# Patient Record
Sex: Male | Born: 1960 | Race: White | Hispanic: Yes | Marital: Single | Smoking: Current every day smoker
Health system: Southern US, Community
[De-identification: ages and names within clinical notes are randomized; demographics above are authoritative.]

---

## 2007-02-22 ENCOUNTER — Emergency Department (HOSPITAL_COMMUNITY): Admission: EM | Admit: 2007-02-22 | Discharge: 2007-02-22 | Payer: Self-pay | Admitting: Emergency Medicine

## 2011-01-12 ENCOUNTER — Emergency Department (HOSPITAL_COMMUNITY)
Admission: EM | Admit: 2011-01-12 | Discharge: 2011-01-12 | Disposition: A | Discharge status: Home / Self Care | Payer: Managed Care, Other (non HMO) | Attending: Emergency Medicine | Admitting: Emergency Medicine

## 2011-01-12 ENCOUNTER — Emergency Department (HOSPITAL_COMMUNITY): Payer: Managed Care, Other (non HMO)

## 2011-01-12 DIAGNOSIS — R071 Chest pain on breathing: Secondary | ICD-10-CM | POA: Insufficient documentation

## 2011-01-12 LAB — CBC
HCT: 40.5 % (ref 39.0–52.0)
Hemoglobin: 14 g/dL (ref 13.0–17.0)
MCH: 27.3 pg (ref 26.0–34.0)
MCHC: 34.6 g/dL (ref 30.0–36.0)
MCV: 79.1 fL (ref 78.0–100.0)
Platelets: 216 K/uL (ref 150–400)
RBC: 5.12 MIL/uL (ref 4.22–5.81)
RDW: 13.3 % (ref 11.5–15.5)
WBC: 9.5 K/uL (ref 4.0–10.5)

## 2011-01-12 LAB — DIFFERENTIAL
Basophils Absolute: 0 10*3/uL (ref 0.0–0.1)
Basophils Relative: 0 % (ref 0–1)
Eosinophils Absolute: 0.1 10*3/uL (ref 0.0–0.7)
Eosinophils Relative: 1 % (ref 0–5)
Lymphocytes Relative: 35 % (ref 12–46)
Lymphs Abs: 3.4 K/uL (ref 0.7–4.0)
Monocytes Absolute: 0.5 10*3/uL (ref 0.1–1.0)
Monocytes Relative: 5 % (ref 3–12)
Neutro Abs: 5.6 K/uL (ref 1.7–7.7)
Neutrophils Relative %: 59 % (ref 43–77)

## 2011-01-12 LAB — BASIC METABOLIC PANEL WITH GFR
CO2: 28 meq/L (ref 19–32)
Calcium: 9.1 mg/dL (ref 8.4–10.5)
Chloride: 106 meq/L (ref 96–112)
Creatinine, Ser: 0.79 mg/dL (ref 0.4–1.5)
Glucose, Bld: 139 mg/dL — ABNORMAL HIGH (ref 70–99)

## 2011-01-12 LAB — POCT CARDIAC MARKERS
CKMB, poc: <1 ng/mL — ABNORMAL LOW (ref 1.0–8.0)
Myoglobin, poc: 83.5 ng/mL (ref 12–200)
Troponin i, poc: <0.05 ng/mL (ref 0.00–0.09)

## 2011-01-12 LAB — BASIC METABOLIC PANEL
BUN: 9 mg/dL (ref 6–23)
GFR calc non Af Amer: >60 mL/min (ref >60)
Potassium: 3.8 mEq/L (ref 3.5–5.1)
Sodium: 139 mEq/L (ref 135–145)

## 2011-05-20 ENCOUNTER — Emergency Department (HOSPITAL_COMMUNITY)
Admission: EM | Admit: 2011-05-20 | Discharge: 2011-05-20 | Disposition: A | Discharge status: Home / Self Care | Payer: Managed Care, Other (non HMO) | Attending: Emergency Medicine | Admitting: Emergency Medicine

## 2011-05-20 DIAGNOSIS — S335XXA Sprain of ligaments of lumbar spine, initial encounter: Secondary | ICD-10-CM | POA: Insufficient documentation

## 2011-05-20 DIAGNOSIS — M549 Dorsalgia, unspecified: Secondary | ICD-10-CM | POA: Insufficient documentation

## 2011-05-20 DIAGNOSIS — X500XXA Overexertion from strenuous movement or load, initial encounter: Secondary | ICD-10-CM | POA: Insufficient documentation

## 2016-06-06 ENCOUNTER — Emergency Department (HOSPITAL_COMMUNITY)
Admission: EM | Admit: 2016-06-06 | Discharge: 2016-06-07 | Disposition: A | Discharge status: Home / Self Care | Payer: No Typology Code available for payment source | Attending: Emergency Medicine | Admitting: Emergency Medicine

## 2016-06-06 ENCOUNTER — Encounter (HOSPITAL_COMMUNITY): Payer: Self-pay | Admitting: Emergency Medicine

## 2016-06-06 DIAGNOSIS — S39012A Strain of muscle, fascia and tendon of lower back, initial encounter: Secondary | ICD-10-CM | POA: Diagnosis not present

## 2016-06-06 DIAGNOSIS — Y9389 Activity, other specified: Secondary | ICD-10-CM | POA: Diagnosis not present

## 2016-06-06 DIAGNOSIS — S2090XA Unspecified superficial injury of unspecified parts of thorax, initial encounter: Secondary | ICD-10-CM | POA: Diagnosis not present

## 2016-06-06 DIAGNOSIS — Y92410 Unspecified street and highway as the place of occurrence of the external cause: Secondary | ICD-10-CM | POA: Diagnosis not present

## 2016-06-06 DIAGNOSIS — S298XXA Other specified injuries of thorax, initial encounter: Secondary | ICD-10-CM

## 2016-06-06 DIAGNOSIS — S3992XA Unspecified injury of lower back, initial encounter: Secondary | ICD-10-CM | POA: Diagnosis present

## 2016-06-06 DIAGNOSIS — F17200 Nicotine dependence, unspecified, uncomplicated: Secondary | ICD-10-CM | POA: Diagnosis not present

## 2016-06-06 DIAGNOSIS — R52 Pain, unspecified: Secondary | ICD-10-CM

## 2016-06-06 DIAGNOSIS — Y999 Unspecified external cause status: Secondary | ICD-10-CM | POA: Insufficient documentation
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
## 2016-06-07 ENCOUNTER — Emergency Department (HOSPITAL_COMMUNITY): Payer: No Typology Code available for payment source

## 2016-06-07 MED ORDER — HYDROCODONE-ACETAMINOPHEN 5-325 MG PO TABS: 1.0000 | ORAL_TABLET | Freq: Four times a day (QID) | ORAL | 0 refills | Status: AC | PRN

## 2016-06-07 MED ORDER — HYDROCODONE-ACETAMINOPHEN 5-325 MG PO TABS
1.0000 | ORAL_TABLET | Freq: Once | ORAL | Status: AC
  Administered 2016-06-07: 1 via ORAL
  Filled 2016-06-07: qty 1
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
## 2016-06-07 NOTE — ED Notes (Signed)
Pt given discharge instructions and expressed understanding.  Pt left department with son.  No reaction to medication suspected at time of discharge.

## 2020-09-17 ENCOUNTER — Emergency Department: Payer: Worker's Compensation

## 2020-09-17 ENCOUNTER — Emergency Department
Admission: EM | Admit: 2020-09-17 | Discharge: 2020-09-17 | Disposition: A | Discharge status: Home / Self Care | Payer: Worker's Compensation | Attending: Emergency Medicine | Admitting: Emergency Medicine

## 2020-09-17 ENCOUNTER — Encounter: Payer: Self-pay | Admitting: *Deleted

## 2020-09-17 ENCOUNTER — Other Ambulatory Visit: Payer: Self-pay

## 2020-09-17 DIAGNOSIS — S67191A Crushing injury of left index finger, initial encounter: Secondary | ICD-10-CM | POA: Insufficient documentation

## 2020-09-17 DIAGNOSIS — Y9261 Building [any] under construction as the place of occurrence of the external cause: Secondary | ICD-10-CM | POA: Diagnosis not present

## 2020-09-17 DIAGNOSIS — Y990 Civilian activity done for income or pay: Secondary | ICD-10-CM | POA: Diagnosis not present

## 2020-09-17 DIAGNOSIS — W231XXA Caught, crushed, jammed, or pinched between stationary objects, initial encounter: Secondary | ICD-10-CM | POA: Diagnosis not present

## 2020-09-17 DIAGNOSIS — S6710XA Crushing injury of unspecified finger(s), initial encounter: Secondary | ICD-10-CM

## 2020-09-17 DIAGNOSIS — S60122A Contusion of left index finger with damage to nail, initial encounter: Secondary | ICD-10-CM | POA: Diagnosis not present

## 2020-09-17 DIAGNOSIS — F17210 Nicotine dependence, cigarettes, uncomplicated: Secondary | ICD-10-CM | POA: Insufficient documentation

## 2020-09-17 DIAGNOSIS — S6010XA Contusion of unspecified finger with damage to nail, initial encounter: Secondary | ICD-10-CM

## 2020-09-17 MED ORDER — IBUPROFEN 600 MG PO TABS: 600.0000 mg | ORAL_TABLET | Freq: Three times a day (TID) | ORAL | 0 refills | Status: AC | PRN

## 2020-09-17 MED ORDER — OXYCODONE-ACETAMINOPHEN 5-325 MG PO TABS
1.0000 | ORAL_TABLET | Freq: Once | ORAL | Status: AC
  Administered 2020-09-17: 1 via ORAL
  Filled 2020-09-17: qty 1

## 2020-09-17 MED ORDER — LIDOCAINE HCL (PF) 1 % IJ SOLN
5.0000 mL | Freq: Once | INTRAMUSCULAR | Status: AC
  Administered 2020-09-17: 5 mL
  Filled 2020-09-17: qty 5

## 2020-09-17 MED ORDER — TRAMADOL HCL 50 MG PO TABS
50.0000 mg | ORAL_TABLET | Freq: Four times a day (QID) | ORAL | 0 refills | Status: AC | PRN
Start: 2020-09-17 — End: ?

## 2020-09-17 NOTE — ED Triage Notes (Signed)
Pt mashed left index finger at work tonight between a box and metal.  Small abrasion and bleeding underneath nail.  Pt alert  Pt denies other injury.

## 2020-09-17 NOTE — ED Notes (Signed)
Left finger #2 with bruised finger nail, swelling. Sensation intact.

## 2020-09-17 NOTE — ED Provider Notes (Signed)
[facility] Emergency Department Provider Note   ____________________________________________   First MD Initiated Contact with Patient 09/17/20 [phone]     (approximate)  I have reviewed the triage vital signs and the nursing notes.   HISTORY  Chief Complaint Finger Injury    HPI Joel Ashley is a 59 y.o. male patient presents with contusion left index finger which happened at work.  Patient states finger got caught between a box and metal.  Patient has bleeding under the nail.  Patient denies loss sensation or loss of function.  Patient rates pain as 8/10.  Patient scribed pain is "achy".  No palliative measure prior to arrival.  Patient is right-hand dominant.         No past medical history on file.  There are no problems to display for this patient.   No past surgical history on file.  Prior to Admission medications   Medication Sig Start Date End Date Taking? Authorizing Provider  HYDROcodone-acetaminophen (NORCO) 5-325 MG tablet Take 1-2 tablets by mouth every 6 (six) hours as needed (for pain). 06/07/16   Molpus, John, MD  ibuprofen (ADVIL) 600 MG tablet Take 1 tablet (600 mg total) by mouth every 8 (eight) hours as needed. 09/17/20   Joni Reining, PA-C  traMADol (ULTRAM) 50 MG tablet Take 1 tablet (50 mg total) by mouth every 6 (six) hours as needed for moderate pain. 09/17/20   Joni Reining, PA-C    Allergies Patient has no known allergies.  No family history on file.  Social History Social History   Tobacco Use  . Smoking status: Current Every Day Smoker  . Smokeless tobacco: Never Used  Substance Use Topics  . Alcohol use: Not Currently  . Drug use: No    Review of Systems Constitutional: No fever/chills Eyes: No visual changes. ENT: No sore throat. Cardiovascular: Denies chest pain. Respiratory: Denies shortness of breath. Gastrointestinal: No abdominal pain.  No nausea, no vomiting.  No diarrhea.  No  constipation. Genitourinary: Negative for dysuria. Musculoskeletal: Left index finger pain. Skin: Negative for rash.  Abrasion and ecchymosis left index finger. Neurological: Negative for headaches, focal weakness or numbness.   ____________________________________________   PHYSICAL EXAM:  VITAL SIGNS: ED Triage Vitals  Enc Vitals Group     BP 09/17/20 0241 (!) 175/91     Pulse Rate 09/17/20 0241 (!) 58     Resp 09/17/20 0241 18     Temp 09/17/20 0241 99.1 F (37.3 C)     Temp Source 09/17/20 0241 Oral     SpO2 09/17/20 0241 97 %     Weight 09/17/20 0239 225 lb (102.1 kg)     Height 09/17/20 0239 5\' 8"  (1.727 m)     Head Circumference --      Peak Flow --      Pain Score 09/17/20 0239 8     Pain Loc --      Pain Edu? --      Excl. in GC? --     Constitutional: Alert and oriented. Well appearing and in no acute distress. Cardiovascular: Normal rate, regular rhythm. Grossly normal heart sounds.  Good peripheral circulation.  Abated blood pressure. Respiratory: Normal respiratory effort.  No retractions. Lungs CTAB. Musculoskeletal: Edema left index finger. Neurologic:  Normal speech and language. No gross focal neurologic deficits are appreciated. No gait instability. Skin:  Skin is warm, dry and intact. No rash noted.  Subungual hematoma left index finger. Psychiatric: Mood and  affect are normal. Speech and behavior are normal.  ____________________________________________   LABS (all labs ordered are listed, but only abnormal results are displayed)  Labs Reviewed - No data to display ____________________________________________  EKG   ____________________________________________  RADIOLOGY I, Joni Reining, personally viewed and evaluated these images (plain radiographs) as part of my medical decision making, as well as reviewing the written report by the radiologist.  ED MD interpretation: No acute findings on x-ray of the left index finger.  Official  radiology report(s): DG Finger Index Left  Result Date: 09/17/2020 CLINICAL DATA:  Crushing injury of the left index finger, small abrasion and bleeding beneath the nail EXAM: LEFT INDEX FINGER 2+V COMPARISON:  None. FINDINGS: Soft tissue swelling of the tip of first digit. Questionable lucency through the radial base of the first distal phalanx, possibly small vascular channel though could reflect a small nondisplaced fracture. Correlate for focal tenderness. No other acute fracture or traumatic osseous injury is identified. IMPRESSION: 1. Soft tissue swelling of the tip of the first digit. 2. Questionable lucency through the radial base of the first distal phalanx, possibly small vascular channel though could reflect a small nondisplaced fracture. Correlate for focal tenderness. Electronically Signed   By: Kreg Shropshire M.D.   On: 09/17/2020 03:16    ____________________________________________   PROCEDURES  Procedure(s) performed (including Critical Care):  .Splint Application  Date/Time: 09/17/2020 8:21 AM Performed by: Ricke Hey, NT Authorized by: Joni Reining, PA-C   Consent:    Consent obtained:  Verbal   Consent given by:  Patient and parent   Risks discussed:  Swelling Pre-procedure details:    Sensation:  Normal Procedure details:    Laterality:  Left   Location:  Finger   Splint type:  Finger   Supplies:  Aluminum splint Post-procedure details:    Pain:  Unchanged   Sensation:  Normal   Patient tolerance of procedure:  Tolerated well, no immediate complications     ____________________________________________   INITIAL IMPRESSION / ASSESSMENT AND PLAN / ED COURSE  As part of my medical decision making, I reviewed the following data within the electronic MEDICAL RECORD NUMBER         Patient presents with pain, edema, and subungual hematoma to left index finger secondary to a crushing incident.  Discussed x-ray findings with patient.  Subungual hematoma  decompressed using cauterizer.  Patient finger was bandaged and splinted.  Patient given discharge care instruction advised take medication as directed.  Patient vies return to ED if condition worsens.      ____________________________________________   FINAL CLINICAL IMPRESSION(S) / ED DIAGNOSES  Final diagnoses:  Crushing injury of finger, initial encounter  Subungual hematoma of finger of left hand, initial encounter     ED Discharge Orders         Ordered    traMADol (ULTRAM) 50 MG tablet  Every 6 hours PRN        09/17/20 0813    ibuprofen (ADVIL) 600 MG tablet  Every 8 hours PRN        09/17/20 0813          *Please note:  RIDER ERMIS was evaluated in Emergency Department on 09/17/2020 for the symptoms described in the history of present illness. He was evaluated in the context of the global COVID-19 pandemic, which necessitated consideration that the patient might be at risk for infection with the SARS-CoV-2 virus that causes COVID-19. Institutional protocols and algorithms that pertain to the evaluation  of patients at risk for COVID-19 are in a state of rapid change based on information released by regulatory bodies including the CDC and federal and state organizations. These policies and algorithms were followed during the patient's care in the ED.  Some ED evaluations and interventions may be delayed as a result of limited staffing during and the pandemic.*   Note:  This document was prepared using Dragon voice recognition software and may include unintentional dictation errors.    Joni Reining, PA-C 09/17/20 0174    Minna Antis, MD 09/17/20 1327

## 2020-09-17 NOTE — Discharge Instructions (Signed)
Follow discharge care instruction take medication as directed.  Wear splint foot 3 to 5 days as needed.

## 2022-06-04 IMAGING — CR DG FINGER INDEX 2+V*L*
1 series · 3 of 3 positions shown · non-contrast
Comparison: None.

CLINICAL DATA: Crushing injury of the left index finger, small
abrasion and bleeding beneath the nail

EXAM:
LEFT INDEX FINGER 2+V

[Series 1: x finger pa left · 0.14mm/px · 3 of 3 slices shown]
[im 1/3]
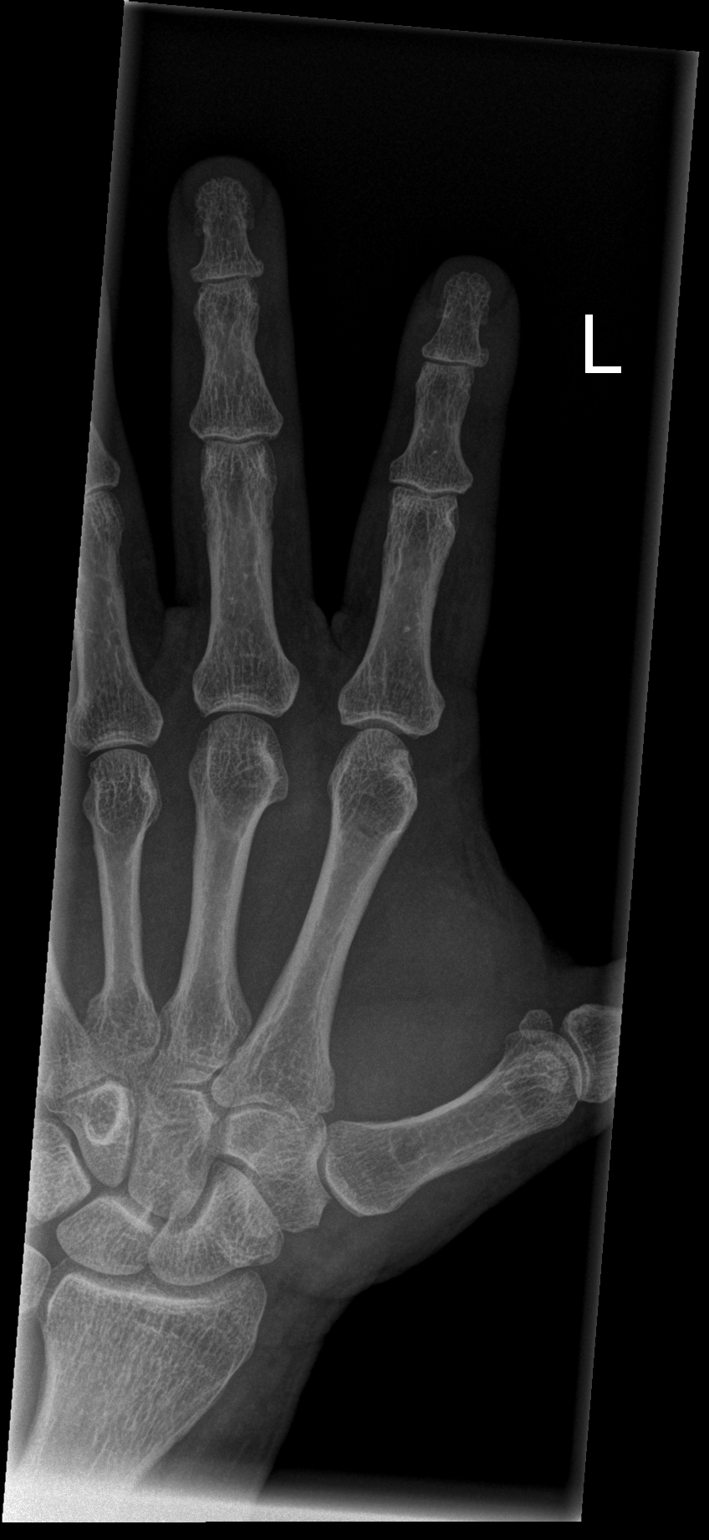
[im 2/3]
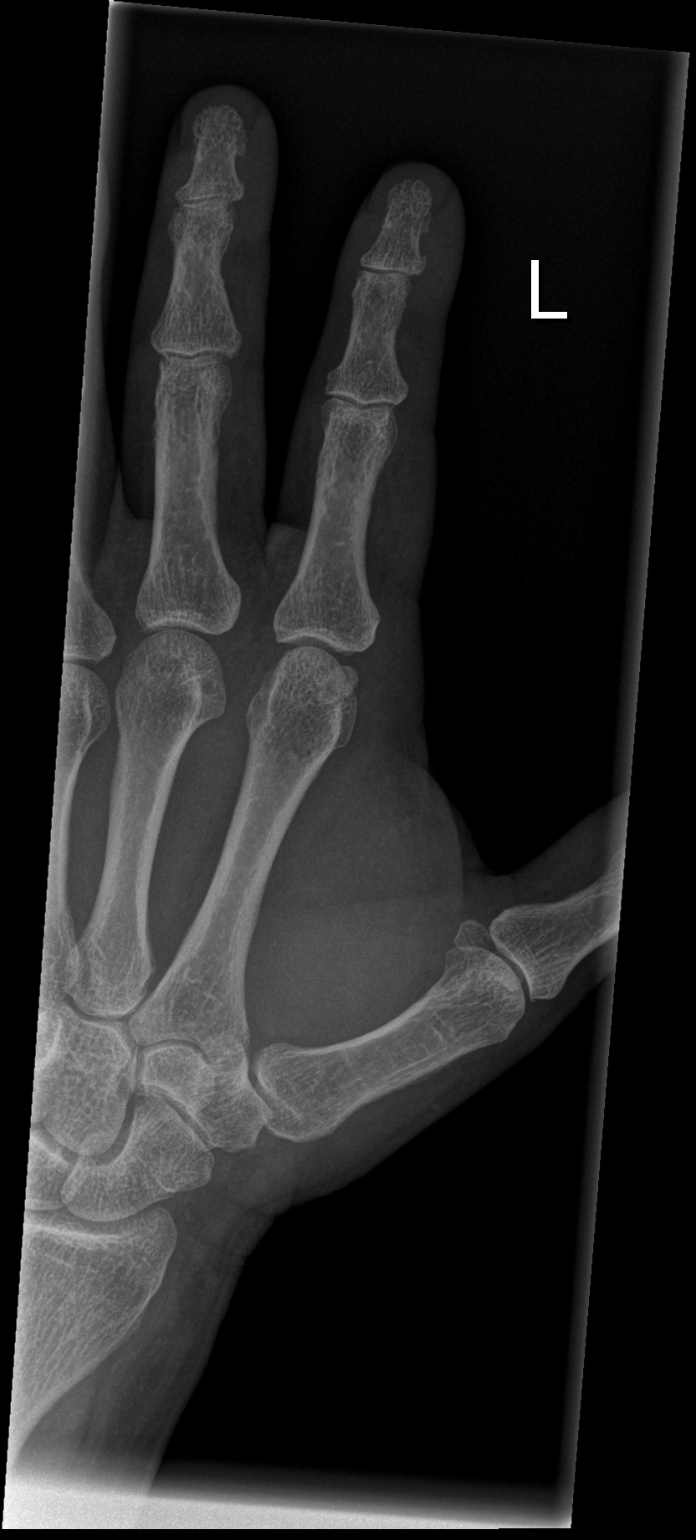
[im 3/3]
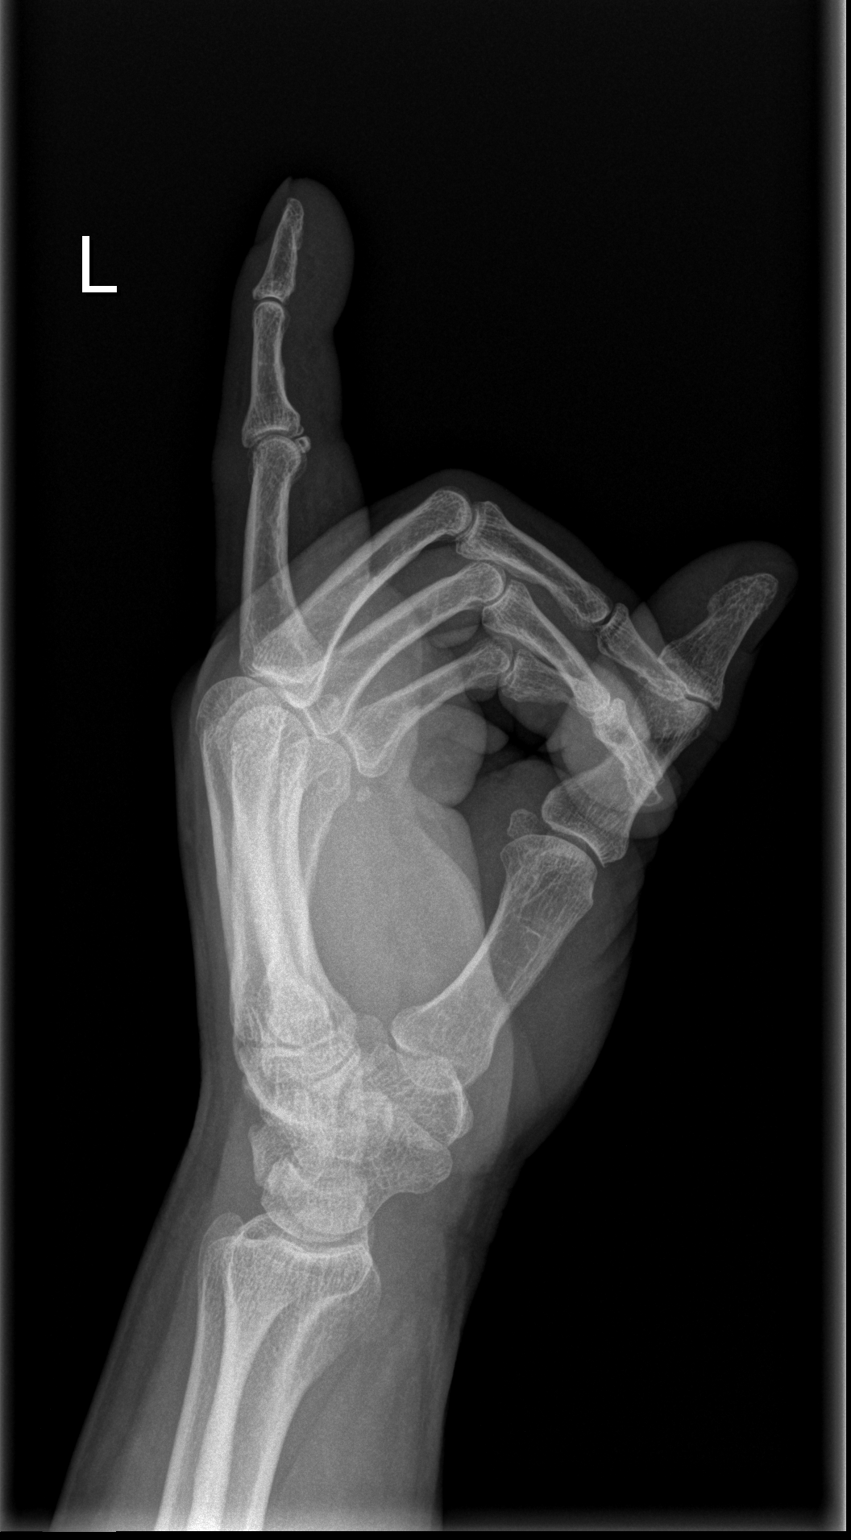

[3 of 3 positions shown; findings below may reference images not displayed]

FINDINGS: Soft tissue swelling of the tip of first digit. Questionable lucency
through the radial base of the first distal phalanx, possibly small
vascular channel though could reflect a small nondisplaced fracture.
Correlate for focal tenderness. No other acute fracture or traumatic
osseous injury is identified.
IMPRESSION: 1. Soft tissue swelling of the tip of the first digit.
2. Questionable lucency through the radial base of the first distal
phalanx, possibly small vascular channel though could reflect a
small nondisplaced fracture. Correlate for focal tenderness.
